# Patient Record
Sex: Male | Born: 1980 | Race: White | Hispanic: No | Marital: Married
Health system: Southern US, Community
[De-identification: ages and names within clinical notes are randomized; demographics above are authoritative.]

---

## 2003-08-29 ENCOUNTER — Ambulatory Visit (HOSPITAL_COMMUNITY): Admission: RE | Admit: 2003-08-29 | Discharge: 2003-08-29 | Payer: Self-pay | Admitting: Family Medicine

## 2004-04-19 ENCOUNTER — Emergency Department (HOSPITAL_COMMUNITY): Admission: EM | Admit: 2004-04-19 | Discharge: 2004-04-19 | Payer: Self-pay | Admitting: Emergency Medicine

## 2004-10-06 IMAGING — US US ABDOMEN COMPLETE
1 series · 14 of 25 positions shown · non-contrast
Comparison: none

CLINICAL DATA: Patient had elevated LFTs.  
 ULTRASOUND OF THE ABDOMEN COMPLETE
 Gallbladder well visualized without evidence of stones.  Common bile duct is normal measuring 3 to 4 mm.  Liver, IVC, pancreas, and spleen are normal.  The right kidney measures 10.8 cm and the left 11.8 cm.  Aorta is normal measuring 1.6 cm. 
 IMPRESSION 
 There is no abnormality.  No gallstones.  No liver abnormality.  The common bile duct is normal in size.

[Series 1: unknown · 0.30mm/px · 14 of 84 slices shown]
[im 1/84]
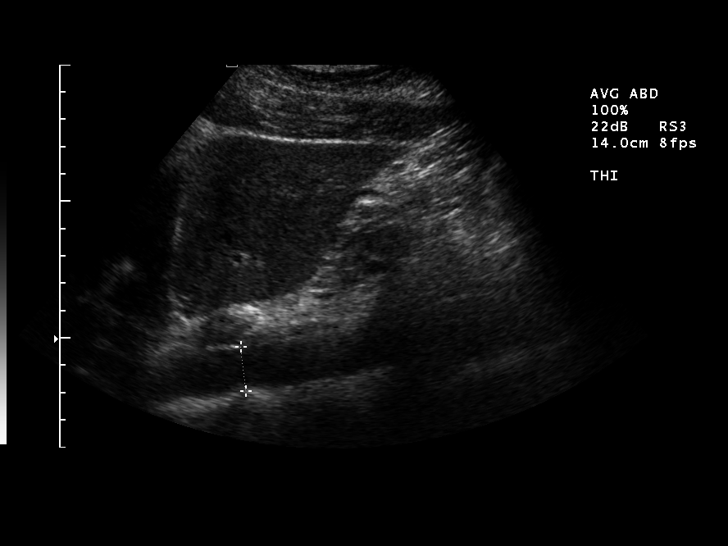
[im 7/84]
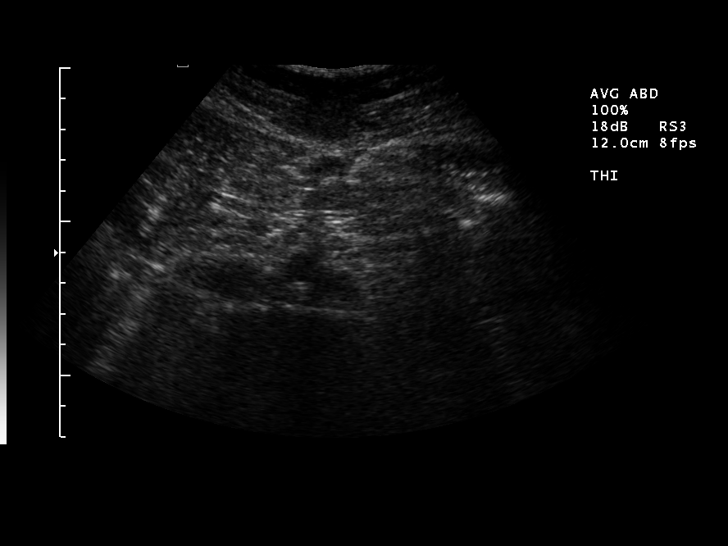
[im 14/84]
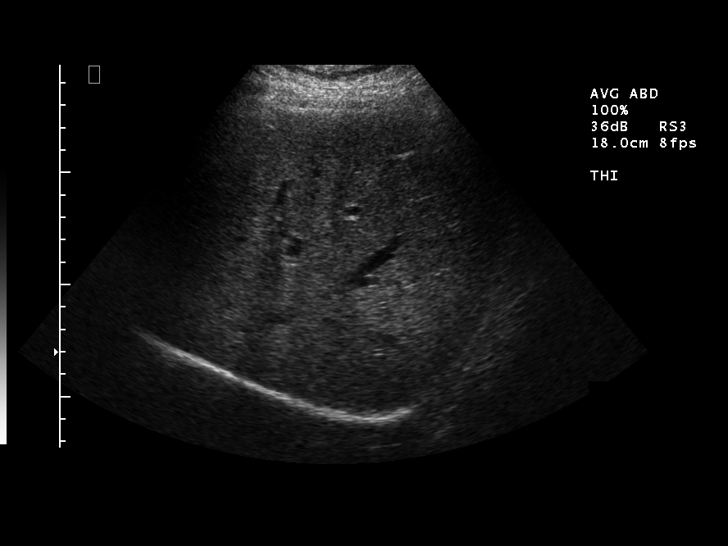
[im 21/84]
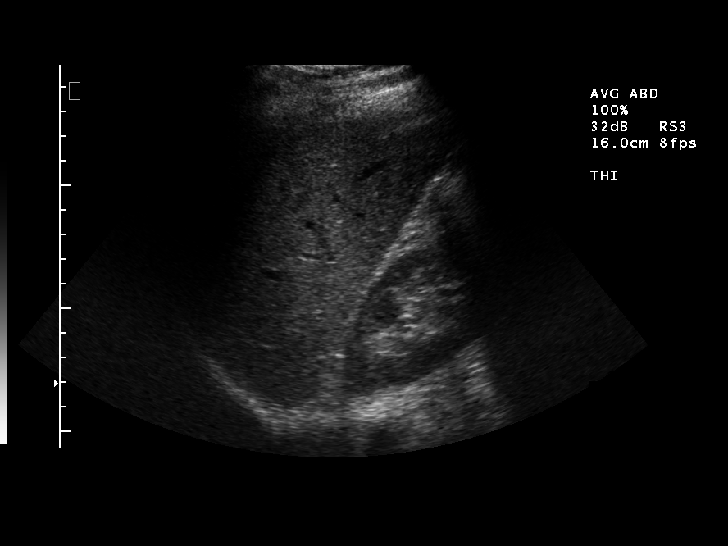
[im 28/84]
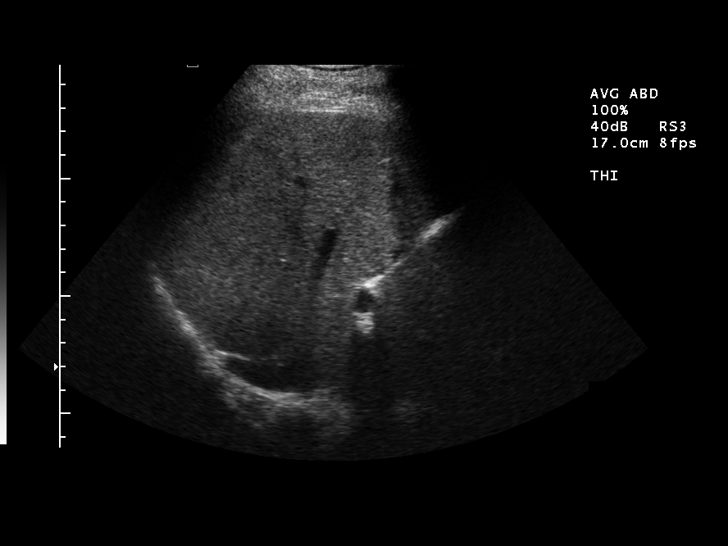
[im 32/84]
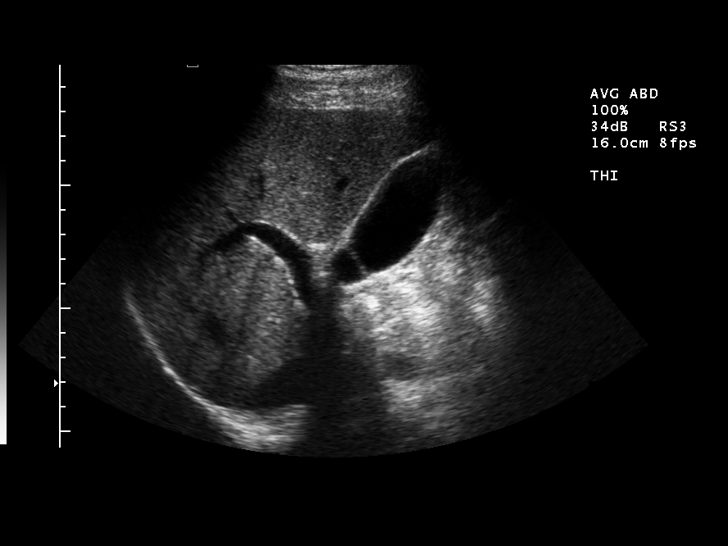
[im 39/84]
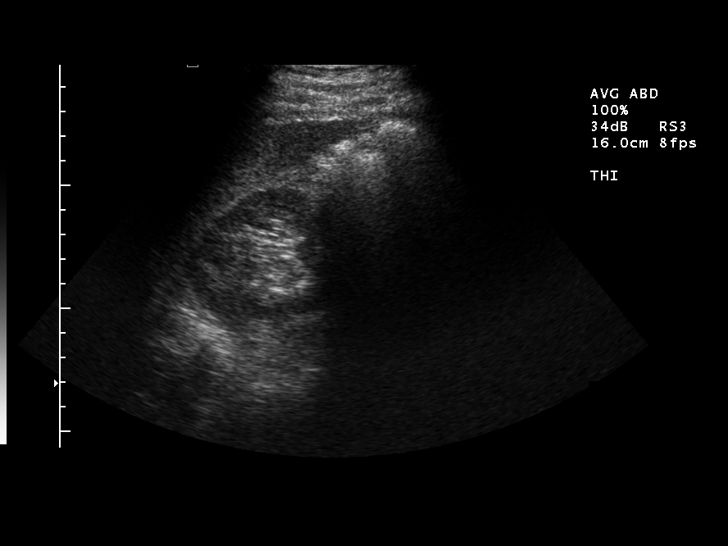
[im 45/84]
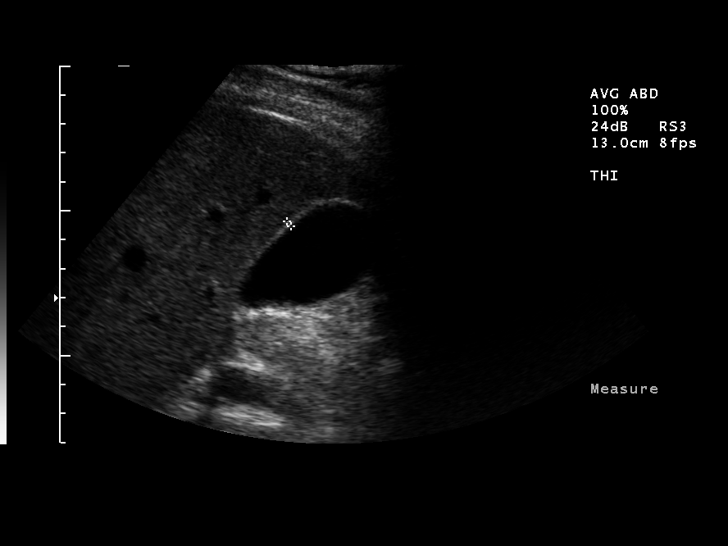
[im 52/84]
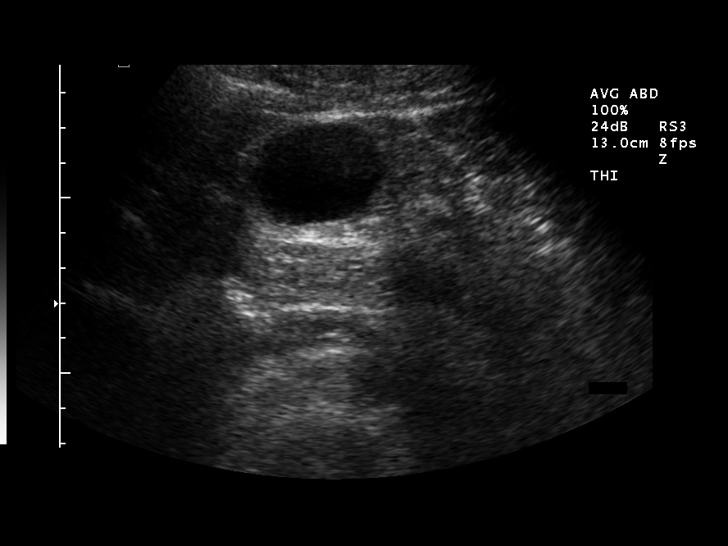
[im 56/84]
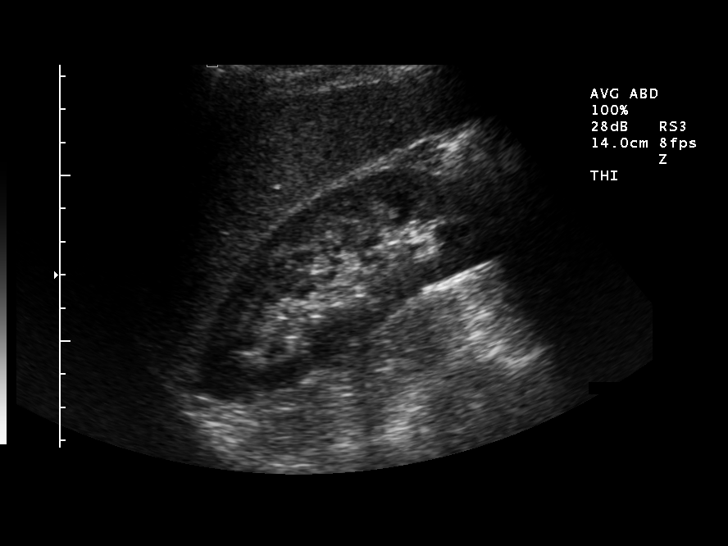
[im 63/84]
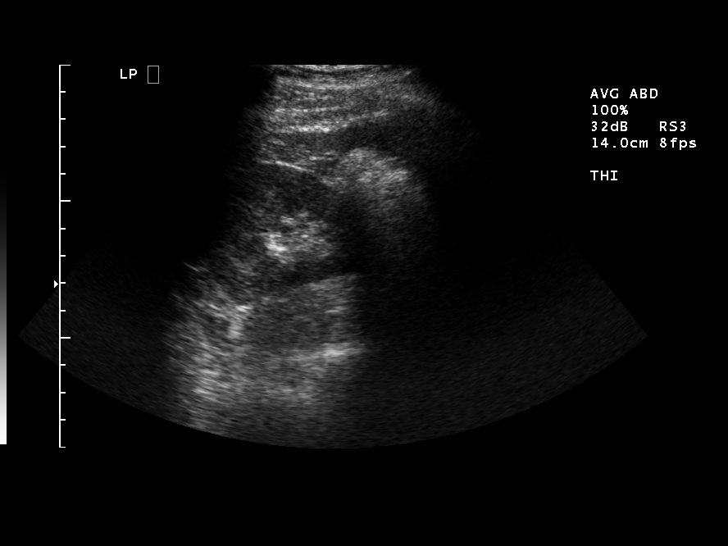
[im 70/84]
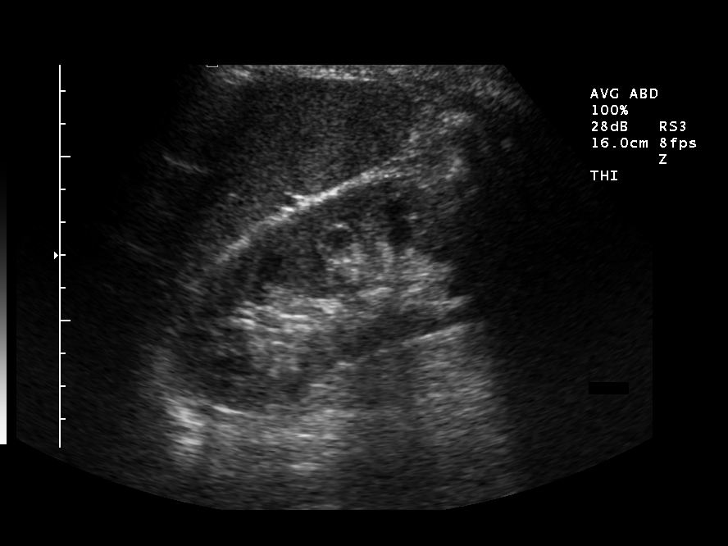
[im 77/84]
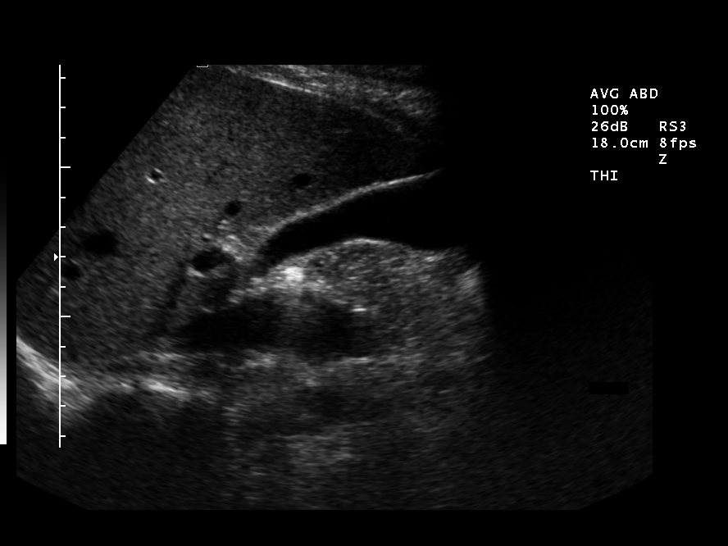
[im 84/84]
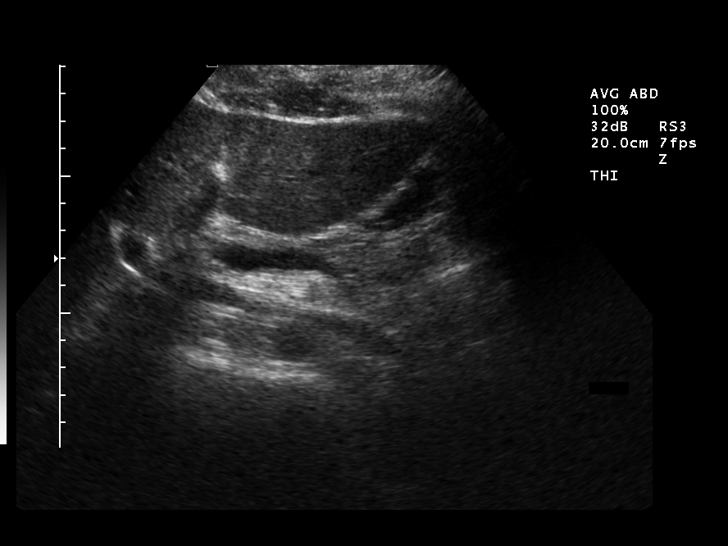

[14 of 25 positions shown; findings below may reference images not displayed]

## 2005-09-22 ENCOUNTER — Emergency Department (HOSPITAL_COMMUNITY): Admission: EM | Admit: 2005-09-22 | Discharge: 2005-09-23 | Payer: Self-pay | Admitting: Emergency Medicine

## 2010-04-26 ENCOUNTER — Encounter: Payer: Self-pay | Admitting: Family Medicine

## 2019-04-09 ENCOUNTER — Ambulatory Visit: Payer: BC Managed Care – PPO | Attending: Internal Medicine

## 2019-04-09 DIAGNOSIS — Z20822 Contact with and (suspected) exposure to covid-19: Secondary | ICD-10-CM

## 2019-04-10 LAB — NOVEL CORONAVIRUS, NAA: SARS-CoV-2, NAA: NOT DETECTED

## 2022-01-28 ENCOUNTER — Ambulatory Visit (INDEPENDENT_AMBULATORY_CARE_PROVIDER_SITE_OTHER): Payer: BC Managed Care – PPO | Admitting: Orthopaedic Surgery

## 2022-01-28 ENCOUNTER — Ambulatory Visit (INDEPENDENT_AMBULATORY_CARE_PROVIDER_SITE_OTHER): Payer: BC Managed Care – PPO

## 2022-01-28 DIAGNOSIS — M25531 Pain in right wrist: Secondary | ICD-10-CM

## 2022-01-28 NOTE — Addendum Note (Signed)
Addended by: Lendon Collar on: 01/28/2022 10:11 AM   Modules accepted: Orders

## 2022-01-28 NOTE — Progress Notes (Signed)
Office Visit Note   Patient: Eric Duffy           Date of Birth: [birth year]           MRN: [id number] Visit Date: 01/28/2022              Requested by: No referring provider defined for this encounter. PCP: No primary care provider on file.   Assessment & Plan: Visit Diagnoses:  1. Pain in right wrist     Plan: Impression is chronic right ulnar-sided wrist pain.  Due to the lack of relief from conservative management we will need an MR arthrogram of the wrist to further assess for structural abnormalities.  Follow-up after the MRI.  Follow-Up Instructions: No follow-ups on file.   Orders:  Orders Placed This Encounter  Procedures   XR Wrist Complete Right   No orders of the defined types were placed in this encounter.     Procedures: No procedures performed   Clinical Data: No additional findings.   Subjective: Chief Complaint  Patient presents with   Right Wrist - Pain    HPI Eric Duffy is a very pleasant 41 year old gentleman right-hand-dominant who has had right wrist pain for about a year that has recently gotten worse over the last 6 to 7 months.  He had an injury while hitting golf balls in the garage about 6 to 7 months ago.  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  He is unable to do push-ups.  Denies any numbness and tingling.  He has seen 2 physical therapists during this time and he has not felt any improvement.  Review of Systems  Constitutional: Negative.   All other systems reviewed and are negative.    Objective: Vital Signs: There were no vitals taken for this visit.  Physical Exam Vitals and nursing note reviewed.  Constitutional:      Appearance: He is well-developed.  HENT:     Head: Normocephalic and atraumatic.  Eyes:     Pupils: Pupils are equal, round, and reactive to light.  Pulmonary:     Effort: Pulmonary effort is normal.  Abdominal:     Palpations: Abdomen is soft.  Musculoskeletal:        General: Normal range of motion.      Cervical back: Neck supple.  Skin:    General: Skin is warm.  Neurological:     Mental Status: He is alert and oriented to person, place, and time.  Psychiatric:        Behavior: Behavior normal.        Thought Content: Thought content normal.        Judgment: Judgment normal.     Ortho Exam Examination of the right wrist shows full range of motion.  Allen test shows 2 to 3 seconds.  Normal capillary refill and sensation.  He is tender over the ulnar styloid and TFCC fovea.  ECU tendon is stable.  He has no tenderness in the palm.  He can make a full composite fist.  Normal skin temperature sensation. Specialty Comments:  No specialty comments available.  Imaging: XR Wrist Complete Right  Result Date: 01/28/2022 Xrays demonstrate no acute or structural abnormalities.   Mildly positive ulnar variance.    PMFS History: There are no problems to display for this patient.  No past medical history on file.  No family history on file.   Social History   Occupational History   Not on file  Tobacco Use  Smoking status: Not on file   Smokeless tobacco: Not on file  Substance and Sexual Activity   Alcohol use: Not on file   Drug use: Not on file   Sexual activity: Not on file

## 2022-02-16 ENCOUNTER — Inpatient Hospital Stay: Admission: RE | Admit: 2022-02-16 | Payer: BC Managed Care – PPO | Source: Ambulatory Visit

## 2022-02-16 ENCOUNTER — Other Ambulatory Visit: Payer: 59

## 2022-03-08 ENCOUNTER — Ambulatory Visit
Admission: RE | Admit: 2022-03-08 | Discharge: 2022-03-08 | Disposition: A | Discharge status: Home / Self Care | Payer: 59 | Source: Ambulatory Visit | Attending: Orthopaedic Surgery | Admitting: Orthopaedic Surgery

## 2022-03-08 DIAGNOSIS — M25531 Pain in right wrist: Secondary | ICD-10-CM

## 2022-03-08 MED ORDER — IOPAMIDOL (ISOVUE-M 200) INJECTION 41%
3.0000 mL | Freq: Once | INTRAMUSCULAR | Status: AC
  Administered 2022-03-08: 3 mL via INTRA_ARTICULAR

## 2022-03-09 NOTE — Progress Notes (Signed)
Needs f/u appt.  Thanks.

## 2022-03-19 ENCOUNTER — Encounter: Payer: Self-pay | Admitting: Orthopaedic Surgery

## 2022-03-19 ENCOUNTER — Ambulatory Visit: Payer: 59 | Admitting: Orthopaedic Surgery

## 2022-03-19 DIAGNOSIS — M25531 Pain in right wrist: Secondary | ICD-10-CM | POA: Diagnosis not present

## 2022-03-21 NOTE — Progress Notes (Signed)
   Office Visit Note   Patient: Eric Duffy           Date of Birth: 12/03/1980           MRN: [id number] Visit Date: 03/19/2022              Requested by: No referring provider defined for this encounter. PCP: Pcp, No   Assessment & Plan: Visit Diagnoses:  1. 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  Based on these findings I have recommended referral to the hand center for surgical consultation.  I have given him my card.  Follow-up as needed.  Follow-Up Instructions: No follow-ups on file.   Orders:  Orders Placed This Encounter  Procedures   Ambulatory referral to Hand Surgery   No orders of the defined types were placed in this encounter.     Procedures: No procedures performed   Clinical Data: No additional findings.   Subjective: Chief Complaint  Patient presents with   Right Wrist - Pain    HPI Eric Duffy 41 year old gentleman who returns today to discuss recent right wrist MR arthrogram.  Symptoms are unchanged. Review of Systems   Objective: Vital Signs: There were no vitals taken for this visit.  Physical Exam  Ortho Exam Examination of right wrist is unchanged. Specialty Comments:  No specialty comments available.  Imaging: No results found.   PMFS History: There are no problems to display for this patient.  No past medical history on file.  No family history on file.   Social History   Occupational History   Not on file  Tobacco Use   Smoking status: Not on file   Smokeless tobacco: Not on file  Substance and Sexual Activity   Alcohol use: Not on file   Drug use: Not on file   Sexual activity: Not on file
